# Patient Record
Sex: Female | Born: 1995 | Race: Black or African American | Hispanic: Yes | Marital: Single | Smoking: Never smoker
Health system: Southern US, Community
[De-identification: ages and names within clinical notes are randomized; demographics above are authoritative.]

---
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
## 2019-09-15 ENCOUNTER — Other Ambulatory Visit: Payer: Self-pay

## 2019-09-15 ENCOUNTER — Emergency Department
Admission: EM | Admit: 2019-09-15 | Discharge: 2019-09-15 | Disposition: A | Discharge status: Home / Self Care | Payer: Self-pay | Attending: Emergency Medicine | Admitting: Emergency Medicine

## 2019-09-15 DIAGNOSIS — R200 Anesthesia of skin: Secondary | ICD-10-CM | POA: Diagnosis not present

## 2019-09-15 DIAGNOSIS — Y998 Other external cause status: Secondary | ICD-10-CM | POA: Diagnosis not present

## 2019-09-15 DIAGNOSIS — Y92410 Unspecified street and highway as the place of occurrence of the external cause: Secondary | ICD-10-CM | POA: Diagnosis not present

## 2019-09-15 DIAGNOSIS — S199XXA Unspecified injury of neck, initial encounter: Secondary | ICD-10-CM | POA: Diagnosis present

## 2019-09-15 DIAGNOSIS — M7918 Myalgia, other site: Secondary | ICD-10-CM | POA: Insufficient documentation

## 2019-09-15 DIAGNOSIS — S161XXA Strain of muscle, fascia and tendon at neck level, initial encounter: Secondary | ICD-10-CM | POA: Insufficient documentation

## 2019-09-15 DIAGNOSIS — Y9389 Activity, other specified: Secondary | ICD-10-CM | POA: Insufficient documentation

## 2019-09-15 MED ORDER — IBUPROFEN 600 MG PO TABS: 600.0000 mg | ORAL_TABLET | Freq: Four times a day (QID) | ORAL | 0 refills | Status: AC | PRN

## 2019-09-15 MED ORDER — CYCLOBENZAPRINE HCL 5 MG PO TABS: 5.0000 mg | ORAL_TABLET | Freq: Three times a day (TID) | ORAL | 0 refills | Status: AC | PRN

## 2019-09-15 MED ORDER — TRAMADOL HCL 50 MG PO TABS: 50.0000 mg | ORAL_TABLET | Freq: Four times a day (QID) | ORAL | 0 refills | Status: AC | PRN

## 2019-09-15 MED ORDER — TRAMADOL HCL 50 MG PO TABS
50.0000 mg | ORAL_TABLET | Freq: Once | ORAL | Status: AC
  Administered 2019-09-15: 50 mg via ORAL
  Filled 2019-09-15: qty 1

## 2019-09-15 MED ORDER — CYCLOBENZAPRINE HCL 10 MG PO TABS
10.0000 mg | ORAL_TABLET | Freq: Once | ORAL | Status: AC
  Administered 2019-09-15: 21:00:00 10 mg via ORAL
  Filled 2019-09-15: qty 1

## 2019-09-15 MED ORDER — IBUPROFEN 800 MG PO TABS
800.0000 mg | ORAL_TABLET | Freq: Once | ORAL | Status: AC
  Administered 2019-09-15: 21:00:00 800 mg via ORAL
  Filled 2019-09-15: qty 1
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
## 2019-09-15 NOTE — Discharge Instructions (Signed)
Please take ibuprofen, Flexeril and tramadol as needed.  You may use Tylenol for additional pain relief.  Avoid driving while taking tramadol or Flexeril as these medications can cause sedation.  Use ice over the left sided neck muscles for the next 2 to 3 days and after you may transition over to heat.  Work on gentle stretching exercises.  If any increasing pain worsening symptoms or urgent changes in your health return to the ER.  Follow-up with primary care provider if no improvement in 1 week.
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
